# Patient Record
Sex: Male | Born: 1995 | Race: White | Hispanic: No | Marital: Single | Smoking: Never smoker
Health system: Southern US, Community
[De-identification: ages and names within clinical notes are randomized; demographics above are authoritative.]

---
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
## 2019-01-05 ENCOUNTER — Other Ambulatory Visit: Payer: Self-pay

## 2019-01-05 ENCOUNTER — Emergency Department (HOSPITAL_COMMUNITY)
Admission: EM | Admit: 2019-01-05 | Discharge: 2019-01-05 | Disposition: A | Discharge status: Home / Self Care | Payer: BC Managed Care – PPO | Attending: Emergency Medicine | Admitting: Emergency Medicine

## 2019-01-05 ENCOUNTER — Emergency Department (HOSPITAL_COMMUNITY): Payer: BC Managed Care – PPO

## 2019-01-05 ENCOUNTER — Encounter (HOSPITAL_COMMUNITY): Payer: Self-pay | Admitting: Emergency Medicine

## 2019-01-05 DIAGNOSIS — R109 Unspecified abdominal pain: Secondary | ICD-10-CM

## 2019-01-05 DIAGNOSIS — R1012 Left upper quadrant pain: Secondary | ICD-10-CM | POA: Insufficient documentation

## 2019-01-05 LAB — URINALYSIS, ROUTINE W REFLEX MICROSCOPIC
Bacteria, UA: NONE SEEN
Bilirubin Urine: NEGATIVE
Glucose, UA: NEGATIVE mg/dL
Ketones, ur: NEGATIVE mg/dL
Leukocytes,Ua: NEGATIVE
Nitrite: NEGATIVE
Protein, ur: 30 mg/dL — AB
RBC / HPF: >50 RBC/hpf — ABNORMAL HIGH (ref 0–5)
Specific Gravity, Urine: 1.02 (ref 1.005–1.030)
pH: 5 (ref 5.0–8.0)

## 2019-01-05 LAB — CBC WITH DIFFERENTIAL/PLATELET
Abs Immature Granulocytes: 0.02 10*3/uL (ref 0.00–0.07)
Basophils Absolute: 0 10*3/uL (ref 0.0–0.1)
Basophils Relative: 1 %
Eosinophils Absolute: 0.1 10*3/uL (ref 0.0–0.5)
Eosinophils Relative: 1 %
HCT: 46.1 % (ref 39.0–52.0)
Hemoglobin: 15.3 g/dL (ref 13.0–17.0)
Immature Granulocytes: 0 %
Lymphocytes Relative: 28 %
Lymphs Abs: 2.1 10*3/uL (ref 0.7–4.0)
MCH: 29.5 pg (ref 26.0–34.0)
MCHC: 33.2 g/dL (ref 30.0–36.0)
MCV: 88.8 fL (ref 80.0–100.0)
Monocytes Absolute: 0.7 10*3/uL (ref 0.1–1.0)
Monocytes Relative: 10 %
Neutro Abs: 4.3 10*3/uL (ref 1.7–7.7)
Neutrophils Relative %: 60 %
Platelets: 207 10*3/uL (ref 150–400)
RBC: 5.19 MIL/uL (ref 4.22–5.81)
RDW: 12.3 % (ref 11.5–15.5)
WBC: 7.2 10*3/uL (ref 4.0–10.5)
nRBC: 0 % (ref 0.0–0.2)

## 2019-01-05 LAB — BASIC METABOLIC PANEL
Anion gap: 9 (ref 5–15)
BUN: 15 mg/dL (ref 6–20)
CO2: 26 mmol/L (ref 22–32)
Calcium: 9.2 mg/dL (ref 8.9–10.3)
Chloride: 104 mmol/L (ref 98–111)
Creatinine, Ser: 0.96 mg/dL (ref 0.61–1.24)
GFR calc Af Amer: >60 mL/min (ref >60)
GFR calc non Af Amer: >60 mL/min (ref >60)
Glucose, Bld: 112 mg/dL — ABNORMAL HIGH (ref 70–99)
Potassium: 3.9 mmol/L (ref 3.5–5.1)
Sodium: 139 mmol/L (ref 135–145)

## 2019-01-05 MED ORDER — ONDANSETRON 4 MG PO TBDP: 4.0000 mg | ORAL_TABLET | Freq: Three times a day (TID) | ORAL | 0 refills | Status: AC | PRN

## 2019-01-05 MED ORDER — KETOROLAC TROMETHAMINE 30 MG/ML IJ SOLN
30.0000 mg | Freq: Once | INTRAMUSCULAR | Status: AC
  Administered 2019-01-05: 30 mg via INTRAVENOUS
  Filled 2019-01-05: qty 1

## 2019-01-05 MED ORDER — IBUPROFEN 600 MG PO TABS: 600.0000 mg | ORAL_TABLET | Freq: Four times a day (QID) | ORAL | 0 refills | Status: AC | PRN

## 2019-01-05 MED ORDER — HYDROMORPHONE HCL 1 MG/ML IJ SOLN
1.0000 mg | Freq: Once | INTRAMUSCULAR | Status: AC
  Administered 2019-01-05: 1 mg via INTRAVENOUS
  Filled 2019-01-05: qty 1

## 2019-01-05 MED ORDER — ONDANSETRON HCL 4 MG/2ML IJ SOLN
4.0000 mg | Freq: Once | INTRAMUSCULAR | Status: AC
Start: 2019-01-05 — End: 2019-01-05
  Administered 2019-01-05: 05:00:00 4 mg via INTRAVENOUS
  Filled 2019-01-05: qty 2

## 2019-01-05 NOTE — ED Provider Notes (Signed)
[facility] EMERGENCY DEPARTMENT Provider Note   CSN: [id number] Arrival date & time: 01/05/19  0430     History   Chief Complaint Chief Complaint  Patient presents with   Flank Pain    HPI Donald Ashley is a 23 y.o. adult.     Patient awoke from sleep with left-sided flank pain that radiates to his abdomen since about 4 AM.  The pain is constant and associated nausea and vomiting x2.  He denies any pain with urination or blood in the urine.  Is able to urinate at home without a problem.  There is been no fever, chills, chest pain or shortness of breath.  He is never had this kind of pain in the past.  No kidney stones.  Felt fine when he went to bed with no recent illnesses.  The history is provided by the patient.  Flank Pain Associated symptoms include abdominal pain. Pertinent negatives include no chest pain and no headaches.    History reviewed. No pertinent past medical history.  Patient Active Problem List   Diagnosis Date Noted   Sprain of ankle, unspecified site 04/14/2013    History reviewed. No pertinent surgical history.   OB History   No obstetric history on file.      Home Medications    Prior to Admission medications   Medication Sig Start Date End Date Taking? Authorizing Provider  naproxen (NAPROSYN) 500 MG tablet Take 1 tablet (500 mg total) by mouth 2 (two) times daily. 04/14/13   Vickki HearingHarrison, Stanley E, MD    Family History No family history on file.  Social History Social History   Tobacco Use   Smoking status: Never Smoker   Smokeless tobacco: Current User  Substance Use Topics   Alcohol use: No   Drug use: No     Allergies   Patient has no known allergies.   Review of Systems Review of Systems  Constitutional: Negative for activity change, appetite change and fever.  Respiratory: Negative for choking.   Cardiovascular: Negative for chest pain.  Gastrointestinal: Positive for abdominal pain, nausea and vomiting.    Genitourinary: Positive for flank pain. Negative for dysuria and hematuria.  Musculoskeletal: Positive for back pain. Negative for myalgias.  Skin: Negative for rash and wound.  Neurological: Negative for dizziness, weakness and headaches.   all other systems are negative except as noted in the HPI and PMH.     Physical Exam Updated Vital Signs BP 125/86 (BP Location: Right Arm)    Pulse 73    Temp 98.1 F (36.7 C) (Oral)    Resp 15    SpO2 99%   Physical Exam Vitals signs and nursing note reviewed.  Constitutional:      General: He is not in acute distress.    Appearance: He is well-developed. He is obese.     Comments: comfortable  HENT:     Head: Normocephalic and atraumatic.     Mouth/Throat:     Pharynx: No oropharyngeal exudate.  Eyes:     Conjunctiva/sclera: Conjunctivae normal.     Pupils: Pupils are equal, round, and reactive to light.  Neck:     Musculoskeletal: Normal range of motion and neck supple.     Comments: No meningismus. Cardiovascular:     Rate and Rhythm: Normal rate and regular rhythm.     Heart sounds: Normal heart sounds. No murmur.  Pulmonary:     Effort: Pulmonary effort is normal. No respiratory distress.  Breath sounds: Normal breath sounds.  Abdominal:     Palpations: Abdomen is soft.     Tenderness: There is abdominal tenderness. There is no guarding or rebound.     Comments: L sided abdominal tenderness without guarding or rebound  Genitourinary:    Comments: No testicular pain Musculoskeletal: Normal range of motion.        General: Tenderness present.     Comments: L CVAT  Skin:    General: Skin is warm.     Capillary Refill: Capillary refill takes less than 2 seconds.  Neurological:     General: No focal deficit present.     Mental Status: He is alert and oriented to person, place, and time. Mental status is at baseline.     Cranial Nerves: No cranial nerve deficit.     Motor: No abnormal muscle tone.     Coordination:  Coordination normal.     Comments: No ataxia on finger to nose bilaterally. No pronator drift. 5/5 strength throughout. CN 2-12 intact.Equal grip strength. Sensation intact.   Psychiatric:        Behavior: Behavior normal.      ED Treatments / Results  Labs (all labs ordered are listed, but only abnormal results are displayed) Labs Reviewed  URINALYSIS, ROUTINE W REFLEX MICROSCOPIC - Abnormal; Notable for the following components:      Result Value   APPearance HAZY (*)    Hgb urine dipstick LARGE (*)    Protein, ur 30 (*)    RBC / HPF >50 (*)    All other components within normal limits  BASIC METABOLIC PANEL - Abnormal; Notable for the following components:   Glucose, Bld 112 (*)    All other components within normal limits  CBC WITH DIFFERENTIAL/PLATELET    EKG None  Radiology Ct Renal Stone Study  Result Date: 01/05/2019 CLINICAL DATA:  Acute onset of left flank pain and vomiting. EXAM: CT ABDOMEN AND PELVIS WITHOUT CONTRAST TECHNIQUE: Multidetector CT imaging of the abdomen and pelvis was performed following the standard protocol without IV contrast. COMPARISON:  01/08/2011 FINDINGS: Lower chest: No acute findings. Hepatobiliary: No mass visualized on this unenhanced exam. Increased mild-to-moderate hepatic steatosis is seen with focal fatty sparing adjacent to the gallbladder fossa. Gallbladder is unremarkable. Pancreas: No mass or inflammatory process visualized on this unenhanced exam. Spleen:  Within normal limits in size. Adrenals/Urinary tract: No evidence of urolithiasis or hydronephrosis. Empty urinary bladder. Stomach/Bowel: No evidence of obstruction, inflammatory process, or abnormal fluid collections. Normal appendix visualized. Vascular/Lymphatic: No pathologically enlarged lymph nodes identified. No evidence of abdominal aortic aneurysm. Reproductive:  No mass or other significant abnormality. Other:  None. Musculoskeletal:  No suspicious bone lesions identified.  IMPRESSION: 1. No evidence of urolithiasis, hydronephrosis, or other acute findings. 2. Hepatic steatosis. Electronically Signed   By: Danae OrleansJohn A Stahl M.D.   On: 01/05/2019 07:03    Procedures Procedures (including critical care time)  Medications Ordered in ED Medications  HYDROmorphone (DILAUDID) injection 1 mg (has no administration in time range)  ondansetron (ZOFRAN) injection 4 mg (has no administration in time range)  ketorolac (TORADOL) 30 MG/ML injection 30 mg (has no administration in time range)     Initial Impression / Assessment and Plan / ED Course  I have reviewed the triage vital signs and the nursing notes.  Pertinent labs & imaging results that were available during my care of the patient were reviewed by me and considered in my medical decision making (see chart for  details).       Flank pain with nausea and vomiting. Concern for kidney stone.  Pain and nausea meds given.  UA with blood, no infection.  CT shows no kidney stone or other acute pathology.  Suspect passed kidney stone. Patient does state he passed "pebble" into specimen cup.  Treat supportively, discussed ureteral spasm should improve. Pain and nausea are controlled.  Followup with PCP. Return to the ED with worsening pain, unable to urinate, fever, vomiting or any other concerns.  Final Clinical Impressions(s) / ED Diagnoses   Final diagnoses:  Left flank pain    ED Discharge Orders    None       Raianna Slight, Annie Main, MD 01/05/19 [phone]

## 2019-01-05 NOTE — ED Triage Notes (Addendum)
Pt C/O left sided flank pain that began around 0350 this AM. Pt states he has vomited 2 times. Pt denies urinary symptoms.

## 2019-01-05 NOTE — Discharge Instructions (Signed)
We suspect that you have passed a kidney stone.  There is no evidence of infection.  Take the anti-inflammatories and nausea medication as prescribed.  Follow-up with your primary doctor.  Return to the ED with worsening pain, fever, vomiting, or other concerns.

## 2019-12-09 ENCOUNTER — Ambulatory Visit
Admission: EM | Admit: 2019-12-09 | Discharge: 2019-12-09 | Disposition: A | Discharge status: Home / Self Care | Payer: BC Managed Care – PPO | Attending: Emergency Medicine | Admitting: Emergency Medicine

## 2019-12-09 ENCOUNTER — Other Ambulatory Visit: Payer: Self-pay

## 2019-12-09 ENCOUNTER — Encounter: Payer: Self-pay | Admitting: Emergency Medicine

## 2019-12-09 DIAGNOSIS — J029 Acute pharyngitis, unspecified: Secondary | ICD-10-CM | POA: Diagnosis present

## 2019-12-09 DIAGNOSIS — J069 Acute upper respiratory infection, unspecified: Secondary | ICD-10-CM | POA: Insufficient documentation

## 2019-12-09 LAB — POCT RAPID STREP A (OFFICE): Rapid Strep A Screen: NEGATIVE

## 2019-12-09 MED ORDER — BENZONATATE 100 MG PO CAPS
100.0000 mg | ORAL_CAPSULE | Freq: Three times a day (TID) | ORAL | 0 refills | Status: AC
Start: 2019-12-09 — End: ?

## 2019-12-09 MED ORDER — FLUTICASONE PROPIONATE 50 MCG/ACT NA SUSP: 1.0000 | Freq: Every day | NASAL | 0 refills | Status: AC

## 2019-12-09 MED ORDER — CETIRIZINE HCL 10 MG PO TABS: 10.0000 mg | ORAL_TABLET | Freq: Every day | ORAL | 0 refills | Status: AC

## 2019-12-09 MED ORDER — PREDNISONE 10 MG PO TABS
20.0000 mg | ORAL_TABLET | Freq: Every day | ORAL | 0 refills | Status: AC
Start: 2019-12-09 — End: 2019-12-14

## 2019-12-09 NOTE — ED Provider Notes (Signed)
[facility]   [id number] 12/09/19 Arrival Time: 4098   Chief Complaint  Patient presents with  . Nasal Congestion  . Sore Throat     SUBJECTIVE: History from: patient.  Donald Ashley is a 24 y.o. adult who presents to the urgent care for complaint of fever, sore throat, nasal congestion and cough for the past 2 days.  Denies sick exposure to COVID, flu or strep.  Denies recent travel.  Has tried OTC medication without relief.  Nothing make his symptoms worse.  Denies previous symptoms in the past.   Denies fever, chills, fatigue, sinus pain, rhinorrhea, SOB, wheezing, chest pain, nausea, changes in bowel or bladder habits.     ROS: As per HPI.  All other pertinent ROS negative.      History reviewed. No pertinent past medical history. History reviewed. No pertinent surgical history. No Known Allergies No current facility-administered medications on file prior to encounter.   Current Outpatient Medications on File Prior to Encounter  Medication Sig Dispense Refill  . ibuprofen (ADVIL) 600 MG tablet Take 1 tablet (600 mg total) by mouth every 6 (six) hours as needed. 30 tablet 0  . naproxen (NAPROSYN) 500 MG tablet Take 1 tablet (500 mg total) by mouth 2 (two) times daily. 30 tablet 0  . ondansetron (ZOFRAN ODT) 4 MG disintegrating tablet Take 1 tablet (4 mg total) by mouth every 8 (eight) hours as needed for nausea or vomiting. 20 tablet 0   Social History   Socioeconomic History  . Marital status: Single    Spouse name: Not on file  . Number of children: Not on file  . Years of education: Not on file  . Highest education level: Not on file  Occupational History  . Not on file  Tobacco Use  . Smoking status: Never Smoker  . Smokeless tobacco: Current User  Substance and Sexual Activity  . Alcohol use: No  . Drug use: No  . Sexual activity: Not on file  Other Topics Concern  . Not on file  Social History Narrative  . Not on file   Social Determinants  of Health   Financial Resource Strain:   . Difficulty of Paying Living Expenses:   Food Insecurity:   . Worried About Programme researcher, broadcasting/film/video in the Last Year:   . Barista in the Last Year:   Transportation Needs:   . Freight forwarder (Medical):   Marland Kitchen Lack of Transportation (Non-Medical):   Physical Activity:   . Days of Exercise per Week:   . Minutes of Exercise per Session:   Stress:   . Feeling of Stress :   Social Connections:   . Frequency of Communication with Friends and Family:   . Frequency of Social Gatherings with Friends and Family:   . Attends Religious Services:   . Active Member of Clubs or Organizations:   . Attends Banker Meetings:   Marland Kitchen Marital Status:   Intimate Partner Violence:   . Fear of Current or Ex-Partner:   . Emotionally Abused:   Marland Kitchen Physically Abused:   . Sexually Abused:    No family history on file.  OBJECTIVE:  Vitals:   12/09/19 0851 12/09/19 0852  BP: 121/83   Pulse: 85   Resp: 19   Temp: 98.6 F (37 C)   TempSrc: Oral   SpO2: 96%   Weight:  250 lb (113.4 kg)  Height:  6' (1.829 m)     General  appearance: alert; appears fatigued, but nontoxic; speaking in full sentences and tolerating own secretions HEENT: NCAT; Ears: EACs clear, TMs pearly gray; Eyes: PERRL.  EOM grossly intact. Sinuses: nontender; Nose: congestion, nares patent without rhinorrhea, Throat: oropharynx clear, tonsils 1+ non erythematous or enlarged, uvula midline  Neck: supple without LAD Lungs: unlabored respirations, symmetrical air entry; cough: moderate; no respiratory distress; CTAB Heart: regular rate and rhythm.  Radial pulses 2+ symmetrical bilaterally Skin: warm and dry Psychological: alert and cooperative; normal mood and affect  LABS:  Results for orders placed or performed during the hospital encounter of 12/09/19 (from the past 24 hour(s))  POCT rapid strep A     Status: None   Collection Time: 12/09/19  9:07 AM  Result Value  Ref Range   Rapid Strep A Screen Negative Negative     ASSESSMENT & PLAN:  1. URI with cough and congestion   2. Sore throat     Meds ordered this encounter  Medications  . cetirizine (ZYRTEC ALLERGY) 10 MG tablet    Sig: Take 1 tablet (10 mg total) by mouth daily.    Dispense:  30 tablet    Refill:  0  . fluticasone (FLONASE) 50 MCG/ACT nasal spray    Sig: Place 1 spray into both nostrils daily for 14 days.    Dispense:  16 g    Refill:  0  . benzonatate (TESSALON) 100 MG capsule    Sig: Take 1 capsule (100 mg total) by mouth every 8 (eight) hours.    Dispense:  30 capsule    Refill:  0  . predniSONE (DELTASONE) 10 MG tablet    Sig: Take 2 tablets (20 mg total) by mouth daily for 5 days.    Dispense:  10 tablet    Refill:  0   Patient stable at discharge.  Symptom is likely viral in origin.  Will prescribe Tessalon Perles for cough, Zyrtec and Flonase for congestion, and low-dose prednisone.  Discharge instructions POCT strep test was negative/sample sent for culture.  We will call you with abnormal result Get plenty of rest and push fluids Tessalon Perles prescribed for cough Zyrtec for nasal congestion, runny nose, and/or sore throat Flonase for nasal congestion and runny nose Use medications daily for symptom relief Use OTC medications like ibuprofen or tylenol as needed fever or pain Call or go to the ED if you have any new or worsening symptoms such as fever, worsening cough, shortness of breath, chest tightness, chest pain, turning blue, changes in mental status, etc...    Reviewed expectations re: course of current medical issues. Questions answered. Outlined signs and symptoms indicating need for more acute intervention. Patient verbalized understanding. After Visit Summary given.      Note: This document was prepared using Dragon voice recognition software and may include unintentional dictation errors.    Durward Parcel, FNP 12/09/19 [phone]

## 2019-12-09 NOTE — Discharge Instructions (Addendum)
POCT strep test was negative/sample sent for culture.  We will call you with abnormal result Get plenty of rest and push fluids Tessalon Perles prescribed for cough Zyrtec for nasal congestion, runny nose, and/or sore throat Flonase for nasal congestion and runny nose Use medications daily for symptom relief Use OTC medications like ibuprofen or tylenol as needed fever or pain Call or go to the ED if you have any new or worsening symptoms such as fever, worsening cough, shortness of breath, chest tightness, chest pain, turning blue, changes in mental status, etc..Marland Kitchen

## 2019-12-09 NOTE — ED Triage Notes (Signed)
Pt reports sore throat that started x 2 days ago, temp was 99 at the time.  Now he has mild cough and nasal congestion.  States his sore throat is better now.

## 2019-12-12 LAB — CULTURE, GROUP A STREP (THRC)

## 2020-05-09 IMAGING — CT CT RENAL STONE PROTOCOL
2 of 4 series · 17 of 46 positions shown, 19 images · non-contrast
Comparison: 01/08/2011

CLINICAL DATA: Acute onset of left flank pain and vomiting.

EXAM:
CT ABDOMEN AND PELVIS WITHOUT CONTRAST
TECHNIQUE: Multidetector CT imaging of the abdomen and pelvis was performed
following the standard protocol without IV contrast.

[Series 2: axial st · axial · 0.98mm/px · z∈[+1114,+1564]mm · 14 of 104 slices shown, 16 images]
[im 7/104  soft-tissue]
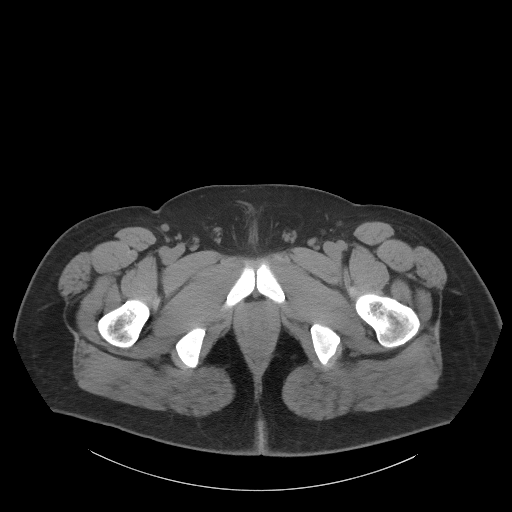
[im 7/104  bone]
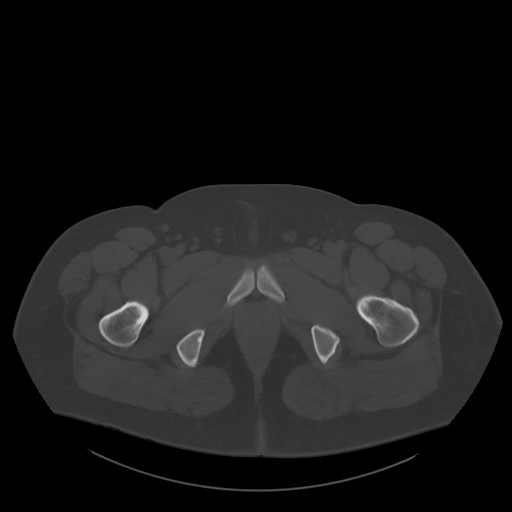
[im 13/104  soft-tissue]
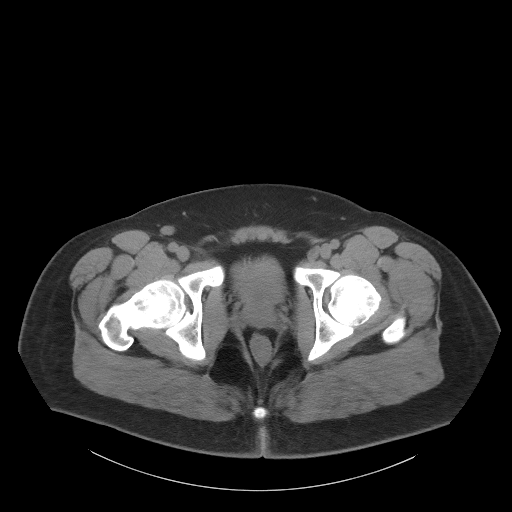
[im 19/104  soft-tissue]
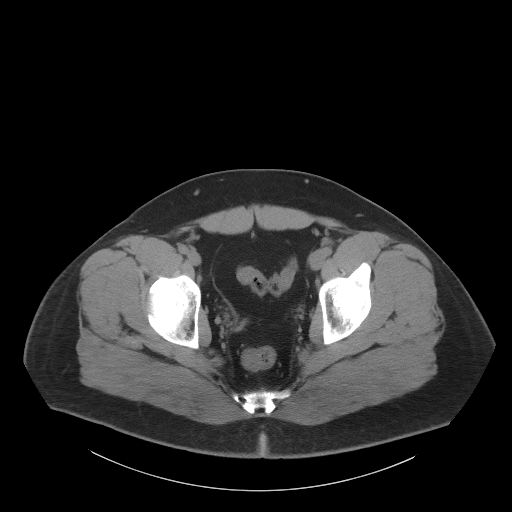
[im 31/104  soft-tissue]
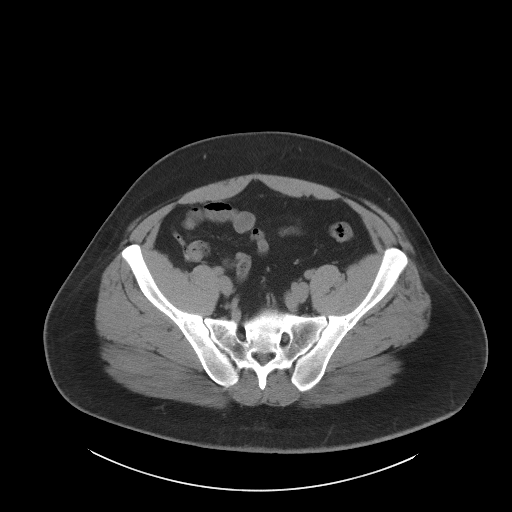
[im 37/104  soft-tissue]
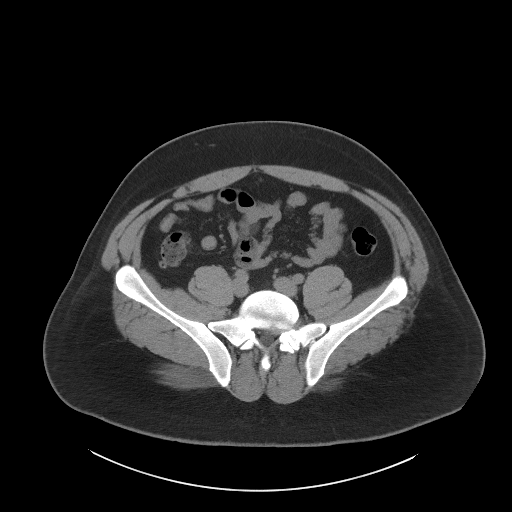
[im 43/104  soft-tissue]
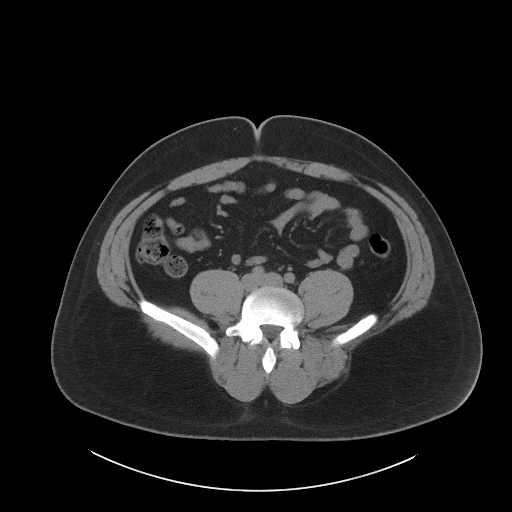
[im 49/104  soft-tissue]
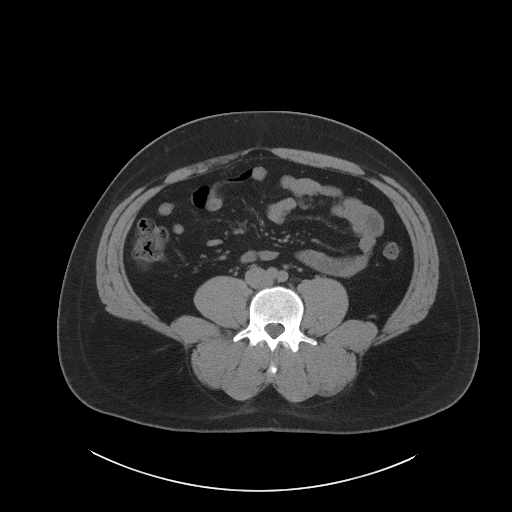
[im 55/104  soft-tissue]
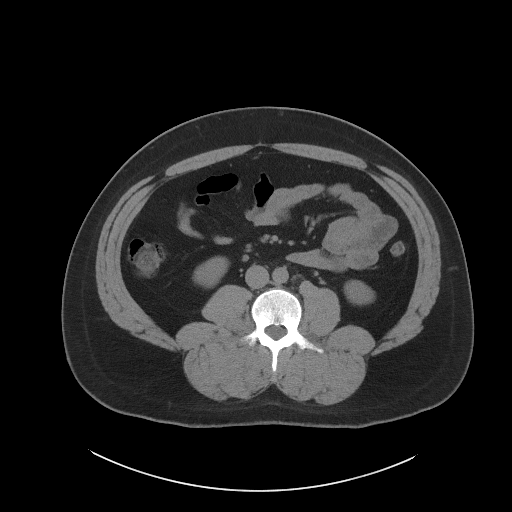
[im 61/104  soft-tissue]
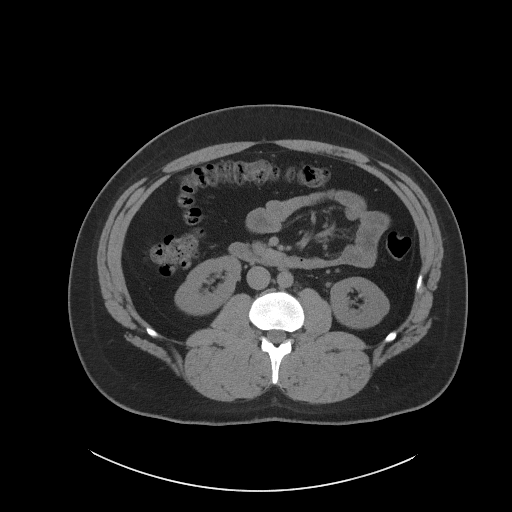
[im 61/104  bone]
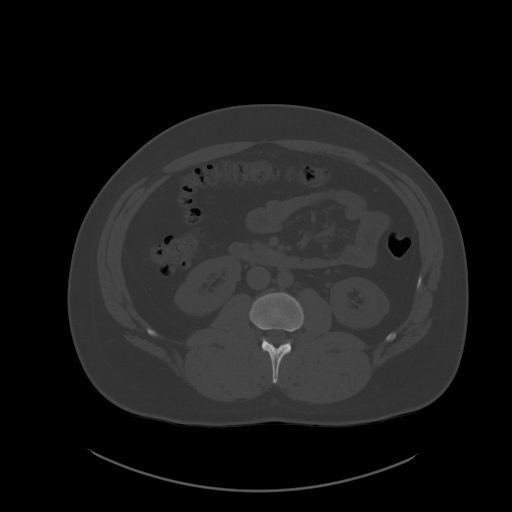
[im 67/104  soft-tissue]
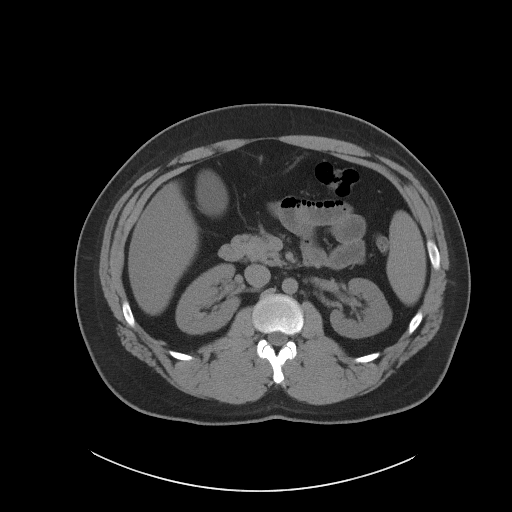
[im 79/104  soft-tissue]
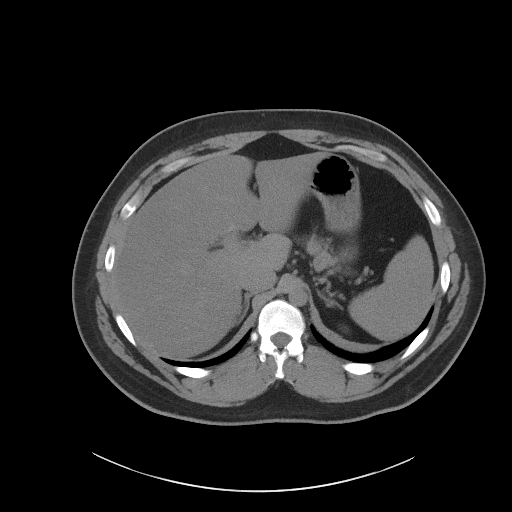
[im 85/104  soft-tissue]
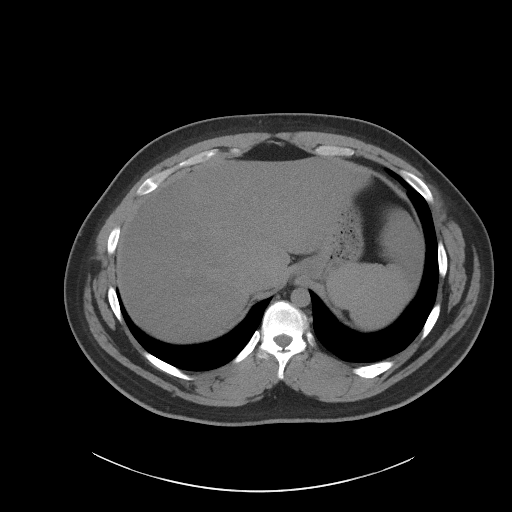
[im 91/104  soft-tissue]
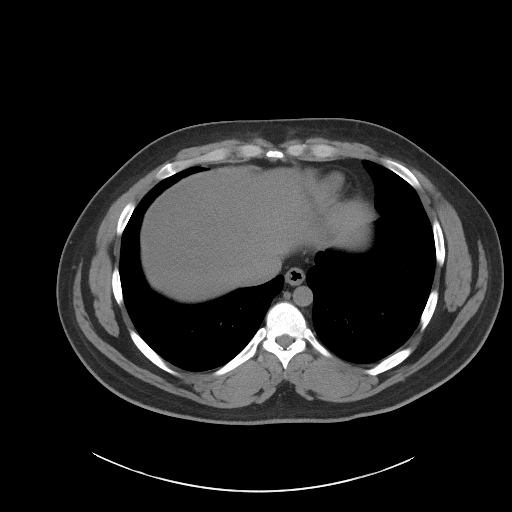
[im 97/104  soft-tissue]
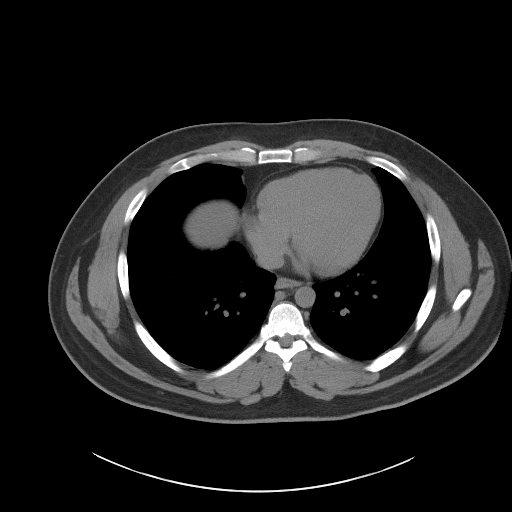

[Series 5: coronal st · coronal · 0.87mm/px · 3 of 101 slices shown]
[im 34/101  soft-tissue]
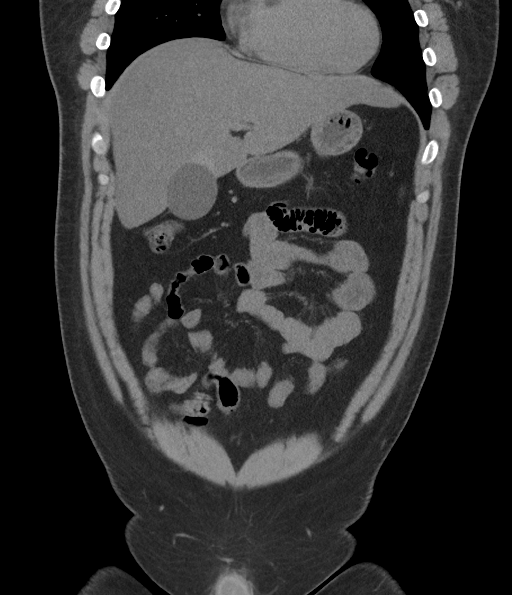
[im 45/101  soft-tissue]
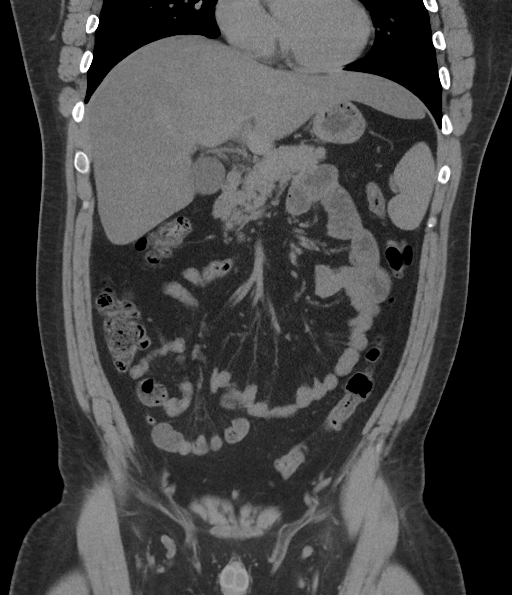
[im 56/101  soft-tissue]
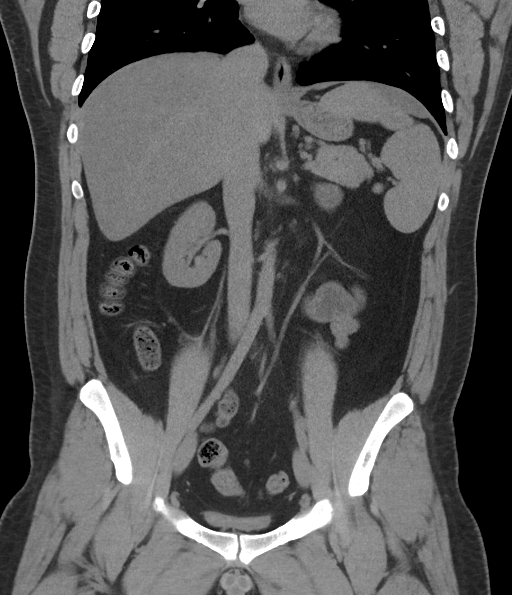

[17 of 46 positions shown; findings below may reference images not displayed]

FINDINGS: Lower chest: No acute findings.

Hepatobiliary: No mass visualized on this unenhanced exam. Increased
mild-to-moderate hepatic steatosis is seen with focal fatty sparing
adjacent to the gallbladder fossa. Gallbladder is unremarkable.

Pancreas: No mass or inflammatory process visualized on this
unenhanced exam.

Spleen:  Within normal limits in size.

Adrenals/Urinary tract: No evidence of urolithiasis or
hydronephrosis. Empty urinary bladder.

Stomach/Bowel: No evidence of obstruction, inflammatory process, or
abnormal fluid collections. Normal appendix visualized.

Vascular/Lymphatic: No pathologically enlarged lymph nodes
identified. No evidence of abdominal aortic aneurysm.

Reproductive:  No mass or other significant abnormality.

Other:  None.

Musculoskeletal:  No suspicious bone lesions identified.
IMPRESSION: 1. No evidence of urolithiasis, hydronephrosis, or other acute
findings.
2. Hepatic steatosis.
